# Patient Record
Sex: Female | Born: 1990 | Race: Black or African American | Hispanic: No | Marital: Single | Smoking: Never smoker
Health system: Southern US, Community
[De-identification: ages and names within clinical notes are randomized; demographics above are authoritative.]

## PROBLEM LIST (undated history)

## (undated) DIAGNOSIS — J45909 Unspecified asthma, uncomplicated: Secondary | ICD-10-CM

## (undated) DIAGNOSIS — F410 Panic disorder [episodic paroxysmal anxiety] without agoraphobia: Secondary | ICD-10-CM

## (undated) HISTORY — PX: TONSILLECTOMY: SUR1361

---

## 2015-12-05 ENCOUNTER — Encounter: Payer: Self-pay | Admitting: Emergency Medicine

## 2015-12-05 ENCOUNTER — Emergency Department
Admission: EM | Admit: 2015-12-05 | Discharge: 2015-12-06 | Disposition: A | Discharge status: Home / Self Care | Payer: No Typology Code available for payment source | Attending: Emergency Medicine | Admitting: Emergency Medicine

## 2015-12-05 ENCOUNTER — Emergency Department: Payer: No Typology Code available for payment source

## 2015-12-05 DIAGNOSIS — Y9389 Activity, other specified: Secondary | ICD-10-CM | POA: Insufficient documentation

## 2015-12-05 DIAGNOSIS — S86902A Unspecified injury of unspecified muscle(s) and tendon(s) at lower leg level, left leg, initial encounter: Secondary | ICD-10-CM | POA: Insufficient documentation

## 2015-12-05 DIAGNOSIS — S40022A Contusion of left upper arm, initial encounter: Secondary | ICD-10-CM | POA: Diagnosis not present

## 2015-12-05 DIAGNOSIS — M25562 Pain in left knee: Secondary | ICD-10-CM

## 2015-12-05 DIAGNOSIS — J45909 Unspecified asthma, uncomplicated: Secondary | ICD-10-CM | POA: Diagnosis not present

## 2015-12-05 DIAGNOSIS — R0781 Pleurodynia: Secondary | ICD-10-CM | POA: Insufficient documentation

## 2015-12-05 DIAGNOSIS — Y999 Unspecified external cause status: Secondary | ICD-10-CM | POA: Diagnosis not present

## 2015-12-05 DIAGNOSIS — T148XXA Other injury of unspecified body region, initial encounter: Secondary | ICD-10-CM

## 2015-12-05 DIAGNOSIS — Y92410 Unspecified street and highway as the place of occurrence of the external cause: Secondary | ICD-10-CM | POA: Insufficient documentation

## 2015-12-05 DIAGNOSIS — S6992XA Unspecified injury of left wrist, hand and finger(s), initial encounter: Secondary | ICD-10-CM | POA: Diagnosis present

## 2015-12-05 HISTORY — DX: Unspecified asthma, uncomplicated: J45.909

## 2015-12-05 HISTORY — DX: Panic disorder (episodic paroxysmal anxiety): F41.0

## 2015-12-05 MED ORDER — ALBUTEROL SULFATE (2.5 MG/3ML) 0.083% IN NEBU
2.5000 mg | INHALATION_SOLUTION | Freq: Once | RESPIRATORY_TRACT | Status: AC
  Administered 2015-12-05: 2.5 mg via RESPIRATORY_TRACT
  Filled 2015-12-05: qty 3

## 2015-12-05 MED ORDER — HYDROCODONE-ACETAMINOPHEN 5-325 MG PO TABS
2.0000 | ORAL_TABLET | Freq: Once | ORAL | Status: AC
  Administered 2015-12-05: 2 via ORAL
  Filled 2015-12-05: qty 2
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
## 2015-12-06 MED ORDER — DOCUSATE SODIUM 100 MG PO CAPS: ORAL_CAPSULE | ORAL | Status: AC

## 2015-12-06 MED ORDER — HYDROCODONE-ACETAMINOPHEN 5-325 MG PO TABS: 1.0000 | ORAL_TABLET | ORAL | Status: AC | PRN
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
